# Patient Record
Sex: Male | Born: 1980 | Race: White | Hispanic: No | Marital: Married | Smoking: Current every day smoker
Health system: Southern US, Community
[De-identification: ages and names within clinical notes are randomized; demographics above are authoritative.]

---
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
## 2003-04-07 ENCOUNTER — Emergency Department (HOSPITAL_COMMUNITY): Admission: EM | Admit: 2003-04-07 | Discharge: 2003-04-08 | Payer: Self-pay | Admitting: Emergency Medicine

## 2005-11-24 ENCOUNTER — Emergency Department (HOSPITAL_COMMUNITY): Admission: EM | Admit: 2005-11-24 | Discharge: 2005-11-25 | Payer: Self-pay

## 2008-02-12 ENCOUNTER — Emergency Department (HOSPITAL_COMMUNITY): Admission: EM | Admit: 2008-02-12 | Discharge: 2008-02-13 | Payer: Self-pay | Admitting: Emergency Medicine

## 2008-02-13 ENCOUNTER — Emergency Department (HOSPITAL_COMMUNITY): Admission: EM | Admit: 2008-02-13 | Discharge: 2008-02-13 | Payer: Self-pay | Admitting: Emergency Medicine

## 2008-11-07 ENCOUNTER — Emergency Department (HOSPITAL_COMMUNITY): Admission: EM | Admit: 2008-11-07 | Discharge: 2008-11-07 | Payer: Self-pay | Admitting: Emergency Medicine

## 2009-03-04 ENCOUNTER — Emergency Department (HOSPITAL_COMMUNITY): Admission: EM | Admit: 2009-03-04 | Discharge: 2009-03-04 | Payer: Self-pay | Admitting: Emergency Medicine
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
## 2010-07-20 LAB — BASIC METABOLIC PANEL
BUN: 9 mg/dL (ref 6–23)
Calcium: 9.8 mg/dL (ref 8.4–10.5)
GFR calc non Af Amer: >60 mL/min (ref >60)
Glucose, Bld: 106 mg/dL — ABNORMAL HIGH (ref 70–99)

## 2010-07-20 LAB — DIFFERENTIAL
Basophils Absolute: 0 10*3/uL (ref 0.0–0.1)
Basophils Relative: 0 % (ref 0–1)
Eosinophils Relative: 4 % (ref 0–5)
Lymphocytes Relative: 7 % — ABNORMAL LOW (ref 12–46)
Neutro Abs: 9.1 10*3/uL — ABNORMAL HIGH (ref 1.7–7.7)

## 2010-07-20 LAB — CBC
HCT: 47.3 % (ref 39.0–52.0)
Platelets: 179 10*3/uL (ref 150–400)
RDW: 13.8 % (ref 11.5–15.5)

## 2010-07-20 LAB — SEDIMENTATION RATE: Sed Rate: 3 mm/hr (ref 0–16)

## 2011-01-13 LAB — CBC
HCT: 45.4
Hemoglobin: 15.5
MCHC: 34.2
MCV: 90.4
Platelets: 231
RBC: 5.02
RDW: 11.9
WBC: 13.1 — ABNORMAL HIGH

## 2011-01-13 LAB — DIFFERENTIAL
Basophils Absolute: 0.1
Basophils Relative: 1
Eosinophils Absolute: 0
Eosinophils Relative: 0
Lymphocytes Relative: 12
Lymphs Abs: 1.5
Monocytes Absolute: 0.9
Monocytes Relative: 7
Neutro Abs: 10.6 — ABNORMAL HIGH
Neutrophils Relative %: 81 — ABNORMAL HIGH

## 2014-08-18 ENCOUNTER — Emergency Department (HOSPITAL_COMMUNITY)
Admission: EM | Admit: 2014-08-18 | Discharge: 2014-08-19 | Disposition: A | Discharge status: Home / Self Care | Payer: Self-pay | Attending: Emergency Medicine | Admitting: Emergency Medicine

## 2014-08-18 ENCOUNTER — Encounter (HOSPITAL_COMMUNITY): Payer: Self-pay | Admitting: Emergency Medicine

## 2014-08-18 DIAGNOSIS — R109 Unspecified abdominal pain: Secondary | ICD-10-CM

## 2014-08-18 DIAGNOSIS — R112 Nausea with vomiting, unspecified: Secondary | ICD-10-CM | POA: Insufficient documentation

## 2014-08-18 DIAGNOSIS — F10120 Alcohol abuse with intoxication, uncomplicated: Secondary | ICD-10-CM | POA: Insufficient documentation

## 2014-08-18 DIAGNOSIS — R197 Diarrhea, unspecified: Secondary | ICD-10-CM | POA: Insufficient documentation

## 2014-08-18 DIAGNOSIS — Z720 Tobacco use: Secondary | ICD-10-CM | POA: Insufficient documentation

## 2014-08-18 DIAGNOSIS — R630 Anorexia: Secondary | ICD-10-CM | POA: Insufficient documentation

## 2014-08-18 DIAGNOSIS — R1031 Right lower quadrant pain: Secondary | ICD-10-CM | POA: Insufficient documentation

## 2014-08-18 DIAGNOSIS — F10920 Alcohol use, unspecified with intoxication, uncomplicated: Secondary | ICD-10-CM

## 2014-08-18 NOTE — ED Notes (Signed)
Bed: [id number] Expected date:  Expected time:  Means of arrival:  Comments: EMS hospice pt, abd pain, recent flu
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
## 2014-08-19 ENCOUNTER — Emergency Department (HOSPITAL_COMMUNITY): Payer: Self-pay

## 2014-08-19 LAB — CBC WITH DIFFERENTIAL/PLATELET
BASOS PCT: 0 % (ref 0–1)
Basophils Absolute: 0 10*3/uL (ref 0.0–0.1)
EOS ABS: 0.1 10*3/uL (ref 0.0–0.7)
Eosinophils Relative: 1 % (ref 0–5)
HEMATOCRIT: 46.1 % (ref 39.0–52.0)
HEMOGLOBIN: 16 g/dL (ref 13.0–17.0)
LYMPHS ABS: 3.9 10*3/uL (ref 0.7–4.0)
LYMPHS PCT: 41 % (ref 12–46)
MCH: 31.7 pg (ref 26.0–34.0)
MCHC: 34.7 g/dL (ref 30.0–36.0)
MCV: 91.3 fL (ref 78.0–100.0)
MONOS PCT: 10 % (ref 3–12)
Monocytes Absolute: 1 10*3/uL (ref 0.1–1.0)
NEUTROS ABS: 4.5 10*3/uL (ref 1.7–7.7)
Neutrophils Relative %: 48 % (ref 43–77)
Platelets: 229 10*3/uL (ref 150–400)
RBC: 5.05 MIL/uL (ref 4.22–5.81)
RDW: 13.2 % (ref 11.5–15.5)
WBC: 9.5 10*3/uL (ref 4.0–10.5)

## 2014-08-19 LAB — COMPREHENSIVE METABOLIC PANEL
ALBUMIN: 4.2 g/dL (ref 3.5–5.0)
ALT: 37 U/L (ref 17–63)
AST: 65 U/L — AB (ref 15–41)
Alkaline Phosphatase: 70 U/L (ref 38–126)
Anion gap: 5 (ref 5–15)
BUN: 13 mg/dL (ref 6–20)
CALCIUM: 8.8 mg/dL — AB (ref 8.9–10.3)
CO2: 28 mmol/L (ref 22–32)
Chloride: 108 mmol/L (ref 101–111)
Creatinine, Ser: 0.9 mg/dL (ref 0.61–1.24)
GFR calc Af Amer: >60 mL/min (ref >60)
GFR calc non Af Amer: >60 mL/min (ref >60)
Glucose, Bld: 101 mg/dL — ABNORMAL HIGH (ref 70–99)
Potassium: 3.8 mmol/L (ref 3.5–5.1)
Sodium: 141 mmol/L (ref 135–145)
TOTAL PROTEIN: 8 g/dL (ref 6.5–8.1)
Total Bilirubin: 0.9 mg/dL (ref 0.3–1.2)

## 2014-08-19 LAB — ETHANOL: ALCOHOL ETHYL (B): 284 mg/dL — AB (ref <5)

## 2014-08-19 MED ORDER — IOHEXOL 300 MG/ML  SOLN
50.0000 mL | Freq: Once | INTRAMUSCULAR | Status: AC | PRN
  Administered 2014-08-19: 50 mL via ORAL

## 2014-08-19 MED ORDER — IOHEXOL 300 MG/ML  SOLN
100.0000 mL | Freq: Once | INTRAMUSCULAR | Status: AC | PRN
  Administered 2014-08-19: 100 mL via INTRAVENOUS

## 2014-08-19 MED ORDER — GI COCKTAIL ~~LOC~~
30.0000 mL | Freq: Once | ORAL | Status: AC
  Administered 2014-08-19: 30 mL via ORAL
  Filled 2014-08-19: qty 30

## 2014-08-19 MED ORDER — SODIUM CHLORIDE 0.9 % IV BOLUS (SEPSIS)
1000.0000 mL | Freq: Once | INTRAVENOUS | Status: AC
  Administered 2014-08-19: 1000 mL via INTRAVENOUS

## 2014-08-19 MED ORDER — ONDANSETRON HCL 4 MG/2ML IJ SOLN
4.0000 mg | Freq: Once | INTRAMUSCULAR | Status: AC
  Administered 2014-08-19: 4 mg via INTRAVENOUS
  Filled 2014-08-19: qty 2

## 2014-08-19 NOTE — Discharge Instructions (Signed)
Abdominal Pain °Many things can cause abdominal pain. Usually, abdominal pain is not caused by a disease and will improve without treatment. It can often be observed and treated at home. Your health care provider will do a physical exam and possibly order blood tests and X-rays to help determine the seriousness of your pain. However, in many cases, more time must pass before a clear cause of the pain can be found. Before that point, your health care provider may not know if you need more testing or further treatment. °HOME CARE INSTRUCTIONS  °Monitor your abdominal pain for any changes. The following actions may help to alleviate any discomfort you are experiencing: °· Only take over-the-counter or prescription medicines as directed by your health care provider. °· Do not take laxatives unless directed to do so by your health care provider. °· Try a clear liquid diet (broth, tea, or water) as directed by your health care provider. Slowly move to a bland diet as tolerated. °SEEK MEDICAL CARE IF: °· You have unexplained abdominal pain. °· You have abdominal pain associated with nausea or diarrhea. °· You have pain when you urinate or have a bowel movement. °· You experience abdominal pain that wakes you in the night. °· You have abdominal pain that is worsened or improved by eating food. °· You have abdominal pain that is worsened with eating fatty foods. °· You have a fever. °SEEK IMMEDIATE MEDICAL CARE IF:  °· Your pain does not go away within 2 hours. °· You keep throwing up (vomiting). °· Your pain is felt only in portions of the abdomen, such as the right side or the left lower portion of the abdomen. °· You pass bloody or black tarry stools. °MAKE SURE YOU: °· Understand these instructions.   °· Will watch your condition.   °· Will get help right away if you are not doing well or get worse.   °Document Released: 01/07/2005 Document Revised: 04/04/2013 Document Reviewed: 12/07/2012 °[employer]® Patient Information  ©2015 [employer]. This information is not intended to replace advice given to you by your health care provider. Make sure you discuss any questions you have with your health care provider. ° °Alcohol Use Disorder °Alcohol use disorder is a mental disorder. It is not a one-time incident of heavy drinking. Alcohol use disorder is the excessive and uncontrollable use of alcohol over time that leads to problems with functioning in one or more areas of daily living. People with this disorder risk harming themselves and others when they drink to excess. Alcohol use disorder also can cause other mental disorders, such as mood and anxiety disorders, and serious physical problems. People with alcohol use disorder often misuse other drugs.  °Alcohol use disorder is common and widespread. Some people with this disorder drink alcohol to cope with or escape from negative life events. Others drink to relieve chronic pain or symptoms of mental illness. People with a family history of alcohol use disorder are at higher risk of losing control and using alcohol to excess.  °SYMPTOMS  °Signs and symptoms of alcohol use disorder may include the following:  °· Consumption of alcohol in larger amounts or over a longer period of time than intended. °· Multiple unsuccessful attempts to cut down or control alcohol use.   °· A great deal of time spent obtaining alcohol, using alcohol, or recovering from the effects of alcohol (hangover). °· A strong desire or urge to use alcohol (cravings).   °· Continued use of alcohol despite problems at work, school, or home because of alcohol   use.   °· Continued use of alcohol despite problems in relationships because of alcohol use. °· Continued use of alcohol in situations when it is physically hazardous, such as driving a car. °· Continued use of alcohol despite awareness of a physical or psychological problem that is likely related to alcohol use. Physical problems related to alcohol use can  involve the brain, heart, liver, stomach, and intestines. Psychological problems related to alcohol use include intoxication, depression, anxiety, psychosis, delirium, and dementia.   °· The need for increased amounts of alcohol to achieve the same desired effect, or a decreased effect from the consumption of the same amount of alcohol (tolerance). °· Withdrawal symptoms upon reducing or stopping alcohol use, or alcohol use to reduce or avoid withdrawal symptoms. Withdrawal symptoms include: °¨ Racing heart. °¨ Hand tremor. °¨ Difficulty sleeping. °¨ Nausea. °¨ Vomiting. °¨ Hallucinations. °¨ Restlessness. °¨ Seizures. °DIAGNOSIS °Alcohol use disorder is diagnosed through an assessment by your health care provider. Your health care provider may start by asking three or four questions to screen for excessive or problematic alcohol use. To confirm a diagnosis of alcohol use disorder, at least two symptoms must be present within a 12-month period. The severity of alcohol use disorder depends on the number of symptoms: °· Mild--two or three. °· Moderate--four or five. °· Severe--six or more. °Your health care provider may perform a physical exam or use results from lab tests to see if you have physical problems resulting from alcohol use. Your health care provider may refer you to a mental health professional for evaluation. °TREATMENT  °Some people with alcohol use disorder are able to reduce their alcohol use to low-risk levels. Some people with alcohol use disorder need to quit drinking alcohol. When necessary, mental health professionals with specialized training in substance use treatment can help. Your health care provider can help you decide how severe your alcohol use disorder is and what type of treatment you need. The following forms of treatment are available:  °· Detoxification. Detoxification involves the use of prescription medicines to prevent alcohol withdrawal symptoms in the first week after quitting.  This is important for people with a history of symptoms of withdrawal and for heavy drinkers who are likely to have withdrawal symptoms. Alcohol withdrawal can be dangerous and, in severe cases, cause death. Detoxification is usually provided in a hospital or in-patient substance use treatment facility. °· Counseling or talk therapy. Talk therapy is provided by substance use treatment counselors. It addresses the reasons people use alcohol and ways to keep them from drinking again. The goals of talk therapy are to help people with alcohol use disorder find healthy activities and ways to cope with life stress, to identify and avoid triggers for alcohol use, and to handle cravings, which can cause relapse. °· Medicines. Different medicines can help treat alcohol use disorder through the following actions: °¨ Decrease alcohol cravings. °¨ Decrease the positive reward response felt from alcohol use. °¨ Produce an uncomfortable physical reaction when alcohol is used (aversion therapy). °· Support groups. Support groups are run by people who have quit drinking. They provide emotional support, advice, and guidance. °These forms of treatment are often combined. Some people with alcohol use disorder benefit from intensive combination treatment provided by specialized substance use treatment centers. Both inpatient and outpatient treatment programs are available. °Document Released: 05/07/2004 Document Revised: 08/14/2013 Document Reviewed: 07/07/2012 °[employer]® Patient Information ©2015 [employer]. This information is not intended to replace advice given to you by your health care provider.   Make sure you discuss any questions you have with your health care provider. ° °

## 2014-08-19 NOTE — ED Provider Notes (Signed)
CSN: [id number]     Arrival date & time 08/18/14  2341 History   First MD Initiated Contact with Patient 08/18/14 2350     Chief Complaint  Patient presents with  . Abdominal Pain     (Consider location/radiation/quality/duration/timing/severity/associated sxs/prior Treatment) Patient is a 34 y.o. male presenting with abdominal pain.  Abdominal Pain Pain location:  Generalized Pain quality: sharp   Pain radiates to:  Does not radiate Pain severity:  No pain Onset quality:  Gradual Duration:  8 weeks Timing:  Intermittent Chronicity:  New Context: alcohol use   Relieved by:  Nothing Worsened by:  Nothing tried Associated symptoms: anorexia, diarrhea, nausea and vomiting   Associated symptoms: no cough     History reviewed. No pertinent past medical history. History reviewed. No pertinent past surgical history. History reviewed. No pertinent family history. History  Substance Use Topics  . Smoking status: Current Every Day Smoker  . Smokeless tobacco: Never Used  . Alcohol Use: Yes    Review of Systems  Respiratory: Negative for cough.   Gastrointestinal: Positive for nausea, vomiting, abdominal pain, diarrhea and anorexia.  All other systems reviewed and are negative.     Allergies  Neosporin and Pepto-bismol  Home Medications   Prior to Admission medications   Not on File   BP 118/82 mmHg  Pulse 93  Temp(Src) 98.4 F (36.9 C) (Oral)  Resp 18  SpO2 96% Physical Exam  Constitutional: He is oriented to person, place, and time. He appears well-developed and well-nourished.  HENT:  Head: Normocephalic and atraumatic.  Eyes: Conjunctivae and EOM are normal.  Neck: Normal range of motion. Neck supple.  Cardiovascular: Normal rate, regular rhythm and normal heart sounds.   Pulmonary/Chest: Effort normal and breath sounds normal. No respiratory distress.  Abdominal: He exhibits no distension. There is tenderness in the right lower quadrant. There is no rebound  and no guarding.  Musculoskeletal: Normal range of motion.  Neurological: He is alert and oriented to person, place, and time.  Skin: Skin is warm and dry.  Vitals reviewed.   ED Course  Procedures (including critical care time) Labs Review Labs Reviewed  COMPREHENSIVE METABOLIC PANEL - Abnormal; Notable for the following:    Glucose, Bld 101 (*)    Calcium 8.8 (*)    AST 65 (*)    All other components within normal limits  ETHANOL - Abnormal; Notable for the following:    Alcohol, Ethyl (B) 284 (*)    All other components within normal limits  CBC WITH DIFFERENTIAL/PLATELET  URINALYSIS, ROUTINE W REFLEX MICROSCOPIC  URINE RAPID DRUG SCREEN (HOSP PERFORMED)    Imaging Review Dg Chest 2 View  08/19/2014   CLINICAL DATA:  Abdominal pain  EXAM: CHEST  2 VIEW  COMPARISON:  None.  FINDINGS: The heart size and mediastinal contours are within normal limits. Both lungs are clear. The visualized skeletal structures are unremarkable.  IMPRESSION: No active cardiopulmonary disease.   Electronically Signed   By: Ellery Plunkaniel R Mitchell M.D.   On: 08/19/2014 01:36   Ct Abdomen Pelvis W Contrast  08/19/2014   CLINICAL DATA:  Left upper quadrant pain for 3 months. Epigastric pain and vomiting tonight.  EXAM: CT ABDOMEN AND PELVIS WITH CONTRAST  TECHNIQUE: Multidetector CT imaging of the abdomen and pelvis was performed using the standard protocol following bolus administration of intravenous contrast.  CONTRAST:  50mL OMNIPAQUE IOHEXOL 300 MG/ML SOLN, 100mL OMNIPAQUE IOHEXOL 300 MG/ML SOLN  COMPARISON:  None.  FINDINGS: Lower chest:  No significant abnormality  Hepatobiliary: There are normal appearances of the liver, gallbladder and bile ducts.  Pancreas: Normal  Spleen: Normal  Adrenals/Urinary Tract: The adrenals and kidneys are normal except for a 2 mm left upper pole collecting system calculus and additional 2 mm calculus in the left lower pole collecting system. There is no hydronephrosis. The ureters  are normal. There is marked distention of the urinary bladder.  Stomach/Bowel: There are normal appearances of the stomach, small bowel and colon. The appendix is normal.  Vascular/Lymphatic: The abdominal aorta is normal in caliber. There is no atherosclerotic calcification. There is no adenopathy in the abdomen or pelvis.  Other: No acute inflammatory changes are evident in the abdomen or pelvis. There is no ascites. There is no adenopathy.  Musculoskeletal: No significant abnormality.  IMPRESSION: *Marked urinary bladder distention. *Left nephrolithiasis   Electronically Signed   By: Ellery Plunkaniel R Mitchell M.D.   On: 08/19/2014 02:21     EKG Interpretation   Date/Time:  Saturday Aug 18 2014 23:43:58 EDT Ventricular Rate:  117 PR Interval:  155 QRS Duration: 82 QT Interval:  339 QTC Calculation: 473 R Axis:   68 Text Interpretation:  Sinus tachycardia Borderline prolonged QT interval  No old tracing to compare Confirmed by Mirian MoGentry, Maytal Mijangos [phone]([id number]) on 08/18/2014  11:50:52 PM      MDM   Final diagnoses:  Abdominal pain, acute  Alcohol intoxication, uncomplicated    34 y.o. male without pertinent PMH presents after EMS called by staff [facility] for pt behaving erratically and complaining of abd pain.  On arrival pt appears intoxicated, complains of chest and abd pain.  Exam otherwise as above.    Wu unremarkable, including CT abd/pelvis.  Likely etiology uncomplicated ETOH intoxication.  DC home in stable condition.    I have reviewed all laboratory and imaging studies if ordered as above  1. Alcohol intoxication, uncomplicated   2. Abdominal pain, acute         Mirian MoMatthew Grisell Bissette, MD 08/19/14 [phone]

## 2015-11-12 DEATH — deceased

## 2016-12-19 IMAGING — CR DG CHEST 2V
2 series · 2 of 2 positions shown · non-contrast
Comparison: None.

CLINICAL DATA: Abdominal pain

EXAM:
CHEST  2 VIEW

[w chest pa]
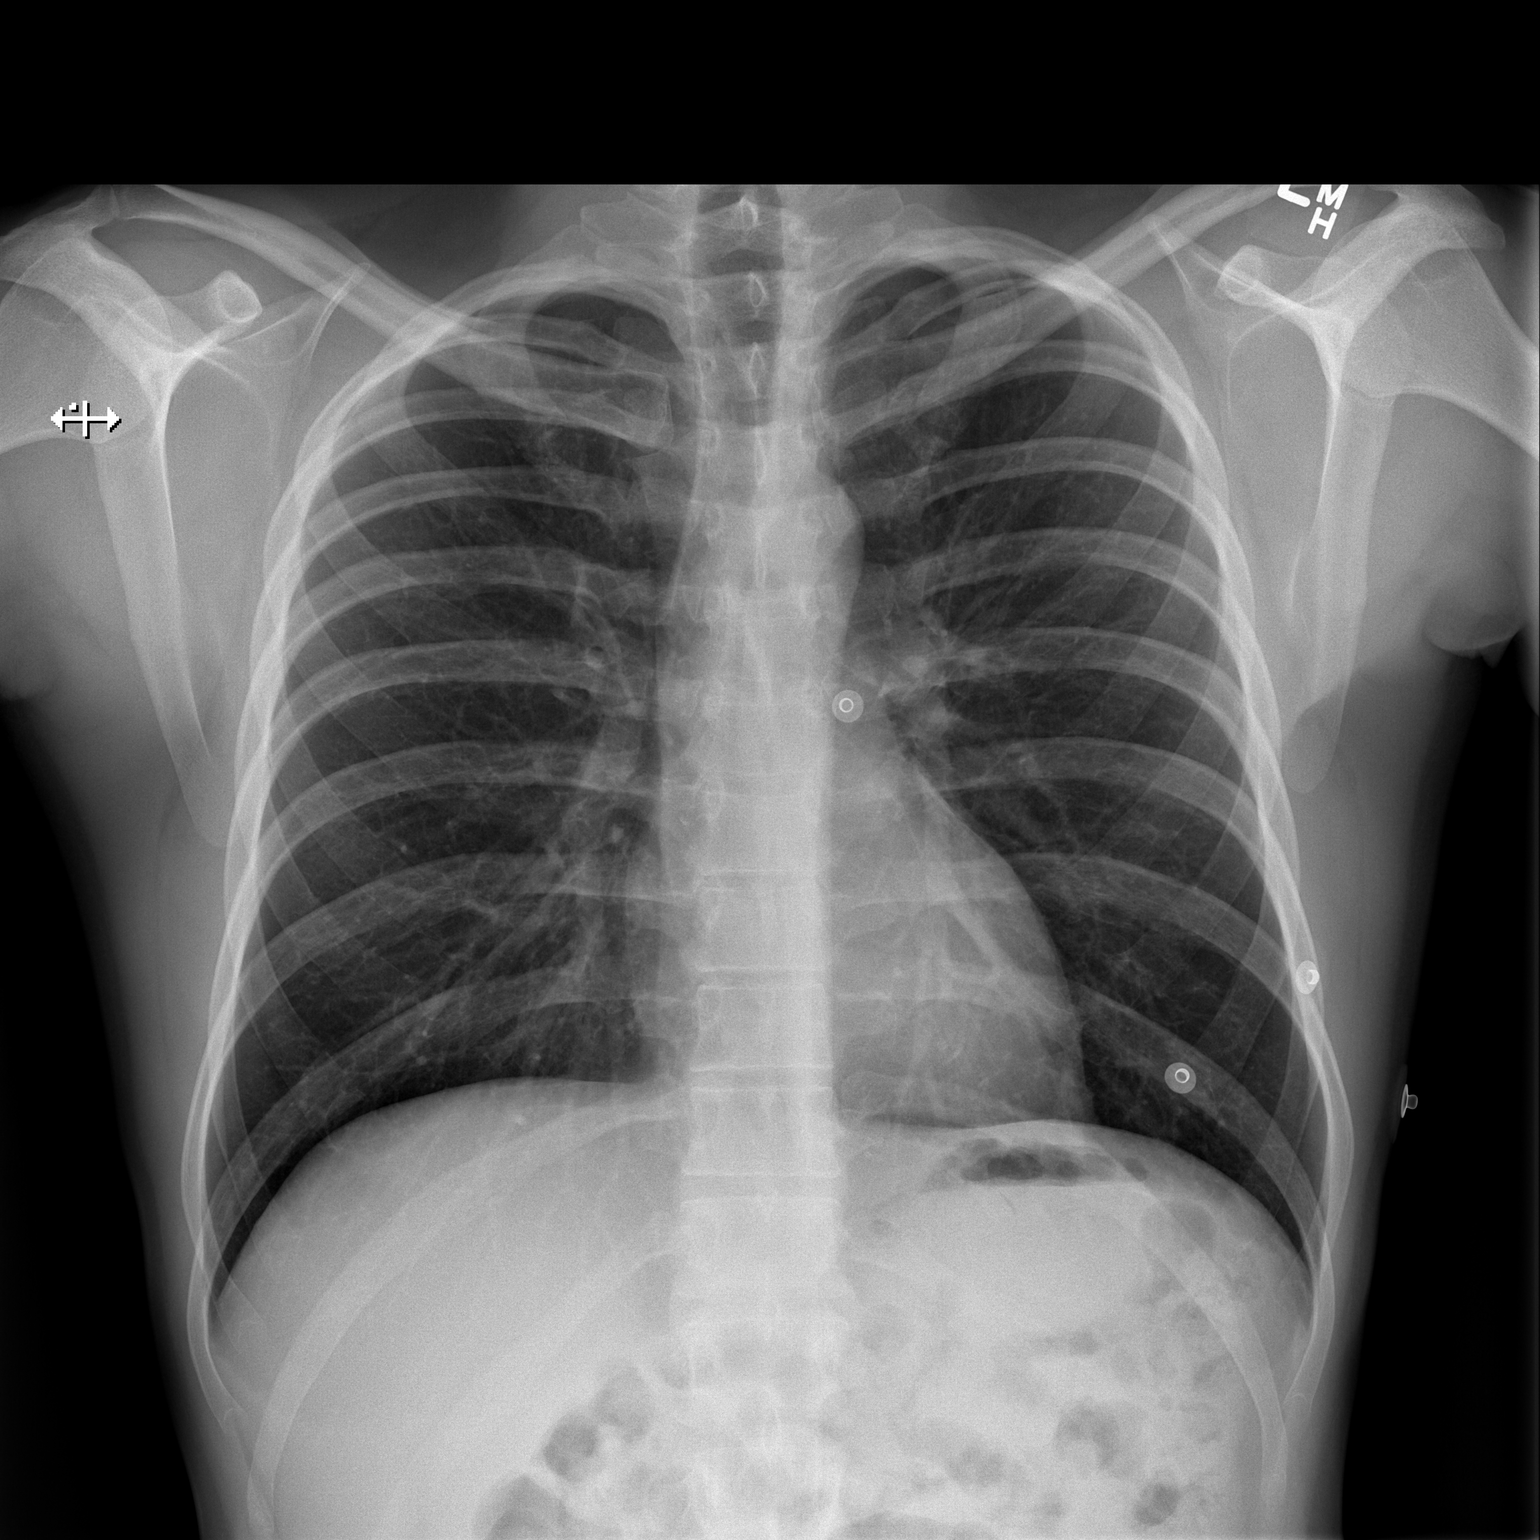

[w chest lat]
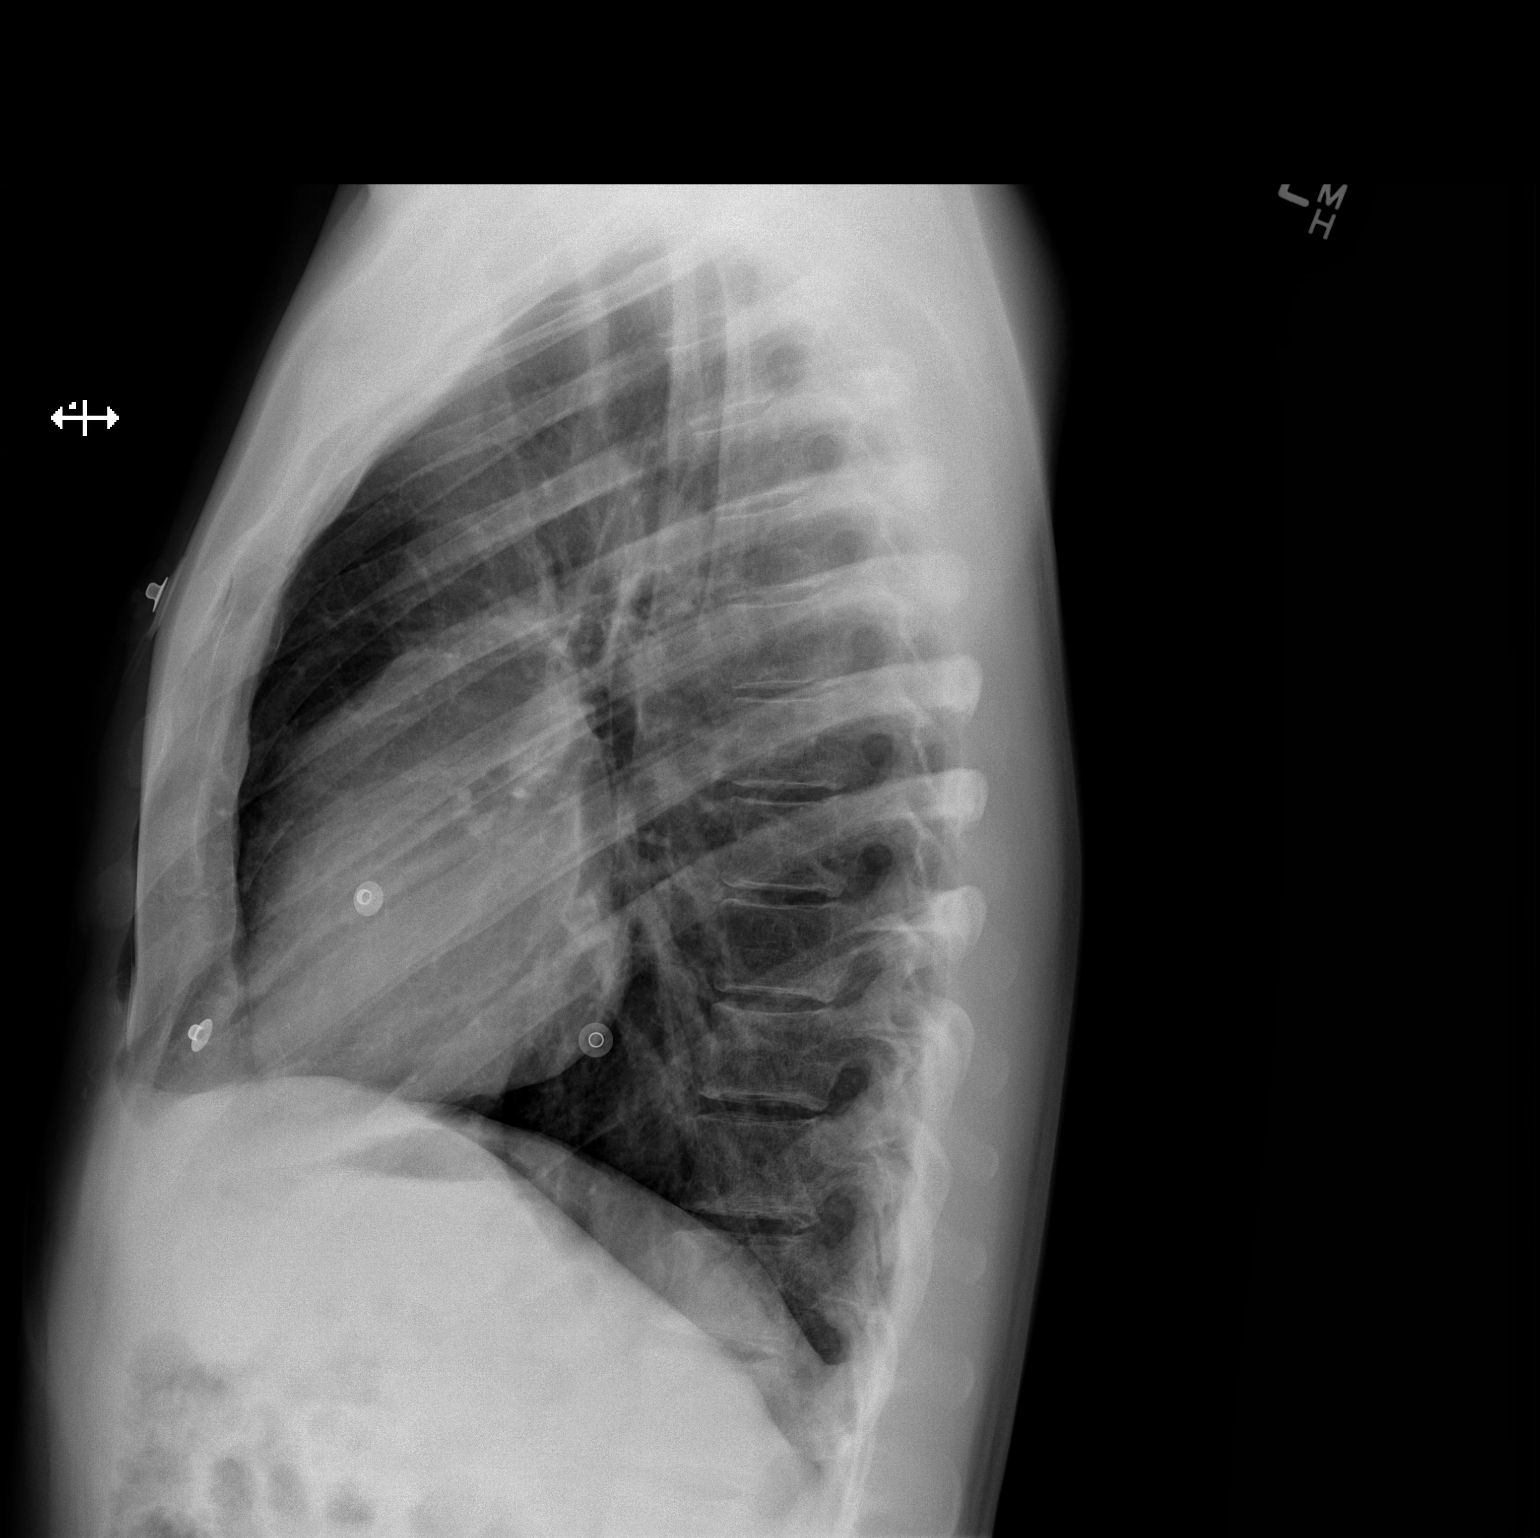

[2 of 2 positions shown; findings below may reference images not displayed]

FINDINGS: The heart size and mediastinal contours are within normal limits.
Both lungs are clear. The visualized skeletal structures are
unremarkable.
IMPRESSION: No active cardiopulmonary disease.
